# Patient Record
Sex: Female | Born: 1960 | Race: White | Hispanic: No | Marital: Married
Health system: Southern US, Community
[De-identification: ages and names within clinical notes are randomized; demographics above are authoritative.]

---

## 1998-06-11 ENCOUNTER — Emergency Department (HOSPITAL_COMMUNITY): Admission: EM | Admit: 1998-06-11 | Discharge: 1998-06-11 | Payer: Self-pay | Admitting: Emergency Medicine

## 1998-06-12 ENCOUNTER — Emergency Department (HOSPITAL_COMMUNITY): Admission: EM | Admit: 1998-06-12 | Discharge: 1998-06-12 | Payer: Self-pay | Admitting: Emergency Medicine

## 2001-08-23 ENCOUNTER — Emergency Department (HOSPITAL_COMMUNITY): Admission: EM | Admit: 2001-08-23 | Discharge: 2001-08-23 | Payer: Self-pay | Admitting: *Deleted

## 2001-08-23 ENCOUNTER — Encounter: Payer: Self-pay | Admitting: *Deleted

## 2002-03-10 ENCOUNTER — Emergency Department (HOSPITAL_COMMUNITY): Admission: EM | Admit: 2002-03-10 | Discharge: 2002-03-10 | Payer: Self-pay | Admitting: Emergency Medicine

## 2003-05-23 ENCOUNTER — Emergency Department (HOSPITAL_COMMUNITY): Admission: EM | Admit: 2003-05-23 | Discharge: 2003-05-23 | Payer: Self-pay | Admitting: Emergency Medicine

## 2004-01-27 ENCOUNTER — Emergency Department (HOSPITAL_COMMUNITY): Admission: EM | Admit: 2004-01-27 | Discharge: 2004-01-27 | Payer: Self-pay | Admitting: Family Medicine

## 2004-04-13 ENCOUNTER — Emergency Department (HOSPITAL_COMMUNITY): Admission: EM | Admit: 2004-04-13 | Discharge: 2004-04-13 | Payer: Self-pay

## 2004-09-28 ENCOUNTER — Emergency Department (HOSPITAL_COMMUNITY): Admission: EM | Admit: 2004-09-28 | Discharge: 2004-09-28 | Payer: Self-pay | Admitting: Emergency Medicine

## 2006-06-02 ENCOUNTER — Emergency Department (HOSPITAL_COMMUNITY): Admission: EM | Admit: 2006-06-02 | Discharge: 2006-06-02 | Payer: Self-pay | Admitting: Emergency Medicine

## 2007-06-14 ENCOUNTER — Emergency Department: Payer: Self-pay | Admitting: Internal Medicine

## 2007-11-28 ENCOUNTER — Other Ambulatory Visit: Payer: Self-pay

## 2007-11-28 ENCOUNTER — Inpatient Hospital Stay: Payer: Self-pay | Admitting: Internal Medicine

## 2007-12-17 ENCOUNTER — Emergency Department: Payer: Self-pay | Admitting: Emergency Medicine

## 2008-12-08 ENCOUNTER — Emergency Department: Payer: Self-pay | Admitting: Emergency Medicine

## 2009-04-23 ENCOUNTER — Emergency Department: Payer: Self-pay | Admitting: Emergency Medicine

## 2009-05-01 ENCOUNTER — Emergency Department: Payer: Self-pay | Admitting: Emergency Medicine

## 2009-09-10 ENCOUNTER — Emergency Department: Payer: Self-pay | Admitting: Emergency Medicine

## 2010-01-17 ENCOUNTER — Emergency Department: Payer: Self-pay | Admitting: Emergency Medicine

## 2010-06-15 ENCOUNTER — Emergency Department: Payer: Self-pay | Admitting: Emergency Medicine

## 2010-06-16 DIAGNOSIS — F411 Generalized anxiety disorder: Secondary | ICD-10-CM | POA: Insufficient documentation

## 2010-06-16 DIAGNOSIS — F17203 Nicotine dependence unspecified, with withdrawal: Secondary | ICD-10-CM | POA: Insufficient documentation

## 2010-07-04 ENCOUNTER — Emergency Department: Payer: Self-pay | Admitting: Emergency Medicine

## 2011-09-13 ENCOUNTER — Emergency Department: Payer: Self-pay | Admitting: Emergency Medicine

## 2011-10-11 ENCOUNTER — Ambulatory Visit: Payer: Self-pay

## 2014-02-02 ENCOUNTER — Emergency Department: Payer: Self-pay | Admitting: Internal Medicine

## 2014-06-04 ENCOUNTER — Emergency Department: Payer: Self-pay | Admitting: Emergency Medicine

## 2015-07-21 ENCOUNTER — Other Ambulatory Visit: Payer: Self-pay | Admitting: Anesthesiology

## 2015-07-21 DIAGNOSIS — R200 Anesthesia of skin: Secondary | ICD-10-CM

## 2015-07-21 DIAGNOSIS — M545 Low back pain, unspecified: Secondary | ICD-10-CM

## 2015-07-21 DIAGNOSIS — R202 Paresthesia of skin: Secondary | ICD-10-CM

## 2015-07-27 ENCOUNTER — Ambulatory Visit: Admission: RE | Admit: 2015-07-27 | Payer: Managed Care, Other (non HMO) | Source: Ambulatory Visit

## 2015-07-30 ENCOUNTER — Ambulatory Visit
Admission: RE | Admit: 2015-07-30 | Discharge: 2015-07-30 | Disposition: A | Discharge status: Home / Self Care | Payer: Managed Care, Other (non HMO) | Source: Ambulatory Visit | Attending: Anesthesiology | Admitting: Anesthesiology

## 2015-07-30 DIAGNOSIS — M545 Low back pain, unspecified: Secondary | ICD-10-CM

## 2015-07-30 DIAGNOSIS — R202 Paresthesia of skin: Secondary | ICD-10-CM | POA: Diagnosis present

## 2015-07-30 DIAGNOSIS — M5127 Other intervertebral disc displacement, lumbosacral region: Secondary | ICD-10-CM | POA: Diagnosis not present

## 2015-07-30 DIAGNOSIS — R200 Anesthesia of skin: Secondary | ICD-10-CM

## 2016-12-04 IMAGING — MR MR LUMBAR SPINE W/O CM
5 series · 39 of 48 positions shown · non-contrast
Comparison: 10/11/2011

CLINICAL DATA: Low back pain running into both hips and down the
right leg with numbness.

EXAM:
MRI LUMBAR SPINE WITHOUT CONTRAST
TECHNIQUE: Multiplanar, multisequence MR imaging of the lumbar spine was
performed. No intravenous contrast was administered.

[Series 2: T2 · sagittal · 4.0mm · 0.81mm/px · 6 of 17 slices shown (1 of 2)]
[im 1/17]
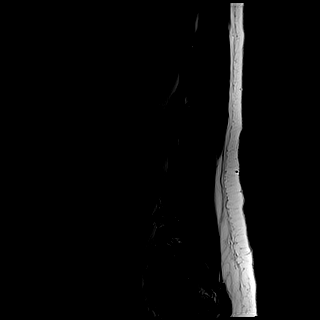
[im 4/17]
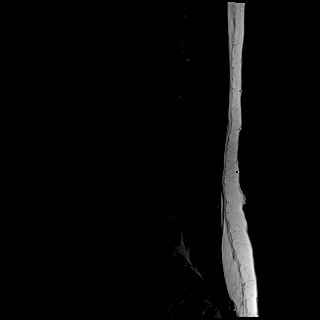
[im 7/17]
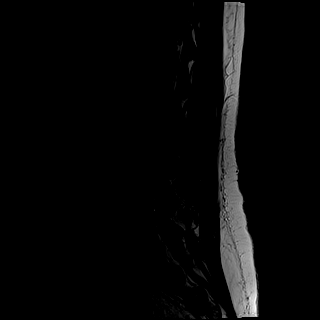
[im 10/17]
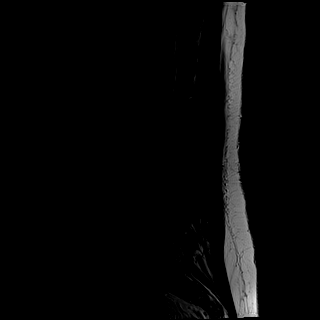
[im 13/17]
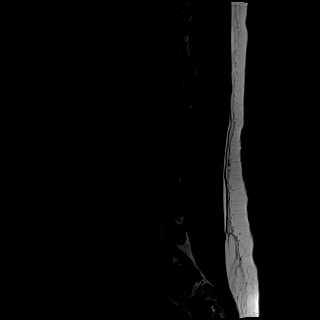
[im 17/17]
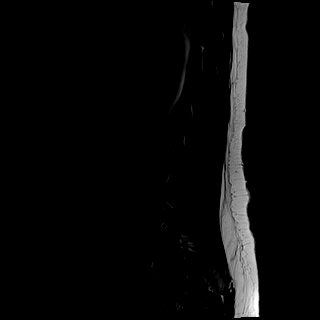

[Series 3: T1 · sagittal · 4.0mm · 0.81mm/px · 7 of 17 slices shown (1 of 2)]
[im 1/17]
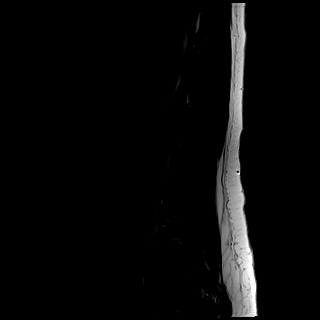
[im 3/17]
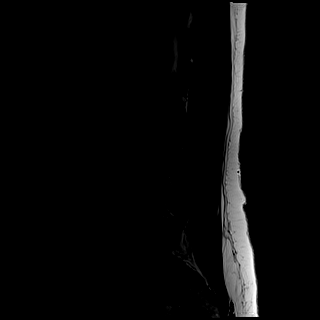
[im 6/17]
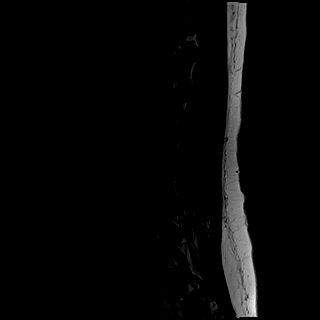
[im 9/17]
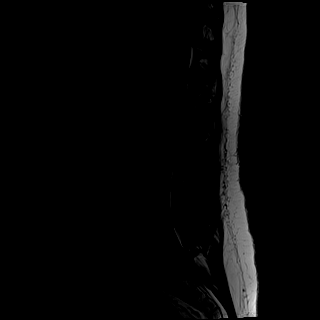
[im 11/17]
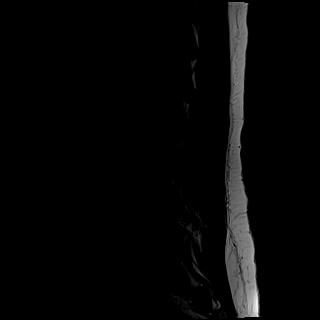
[im 14/17]
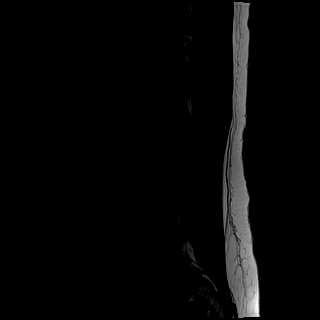
[im 17/17]
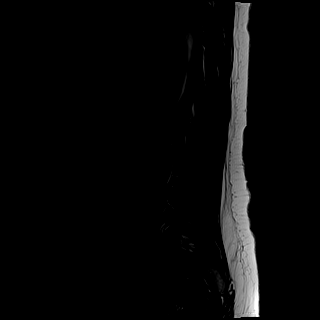

[Series 4: STIR · sagittal · 4.0mm · 1.02mm/px · 7 of 17 slices shown]
[im 1/17]
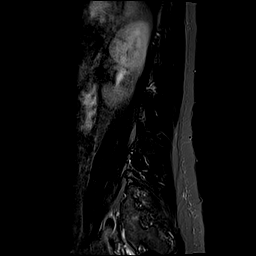
[im 3/17]
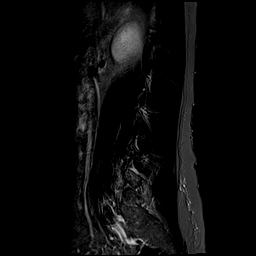
[im 6/17]
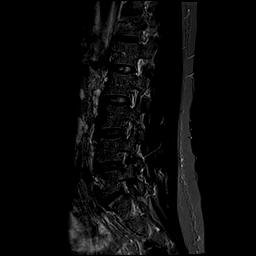
[im 9/17]
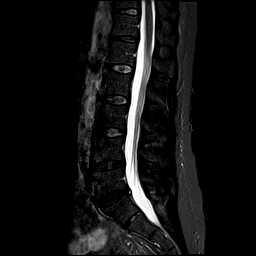
[im 11/17]
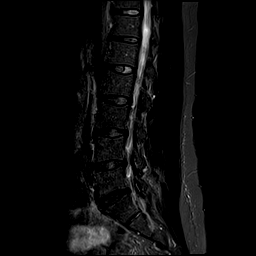
[im 14/17]
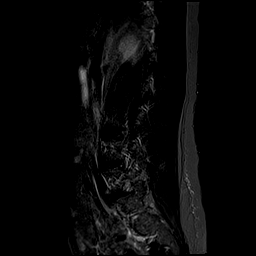
[im 17/17]
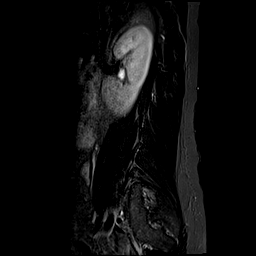

[Series 5: T2 · axial · 4.0mm · 0.78mm/px · z∈[-71,+137]mm · 11 of 37 slices shown (2 of 2)]
[im 1/37]
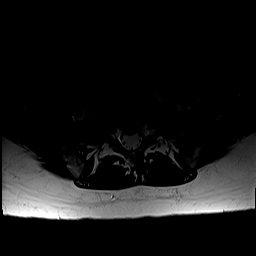
[im 3/37]
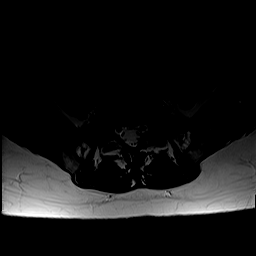
[im 6/37]
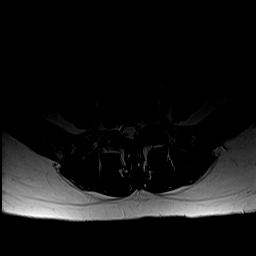
[im 9/37]
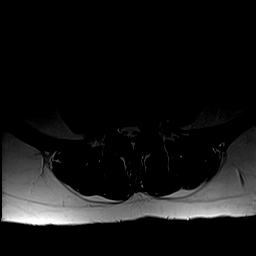
[im 12/37]
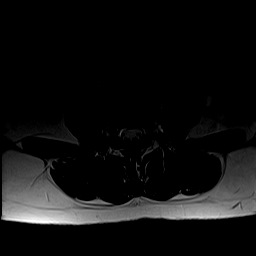
[im 14/37]
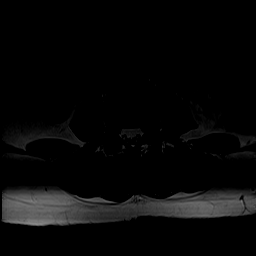
[im 17/37]
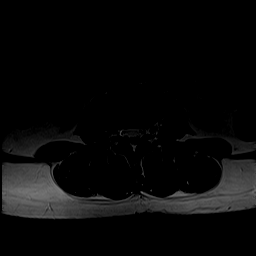
[im 20/37]
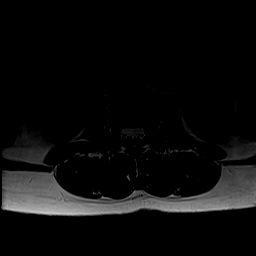
[im 25/37]
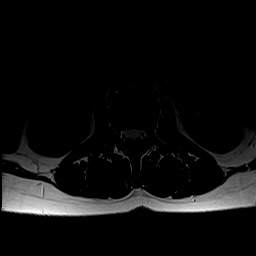
[im 31/37]
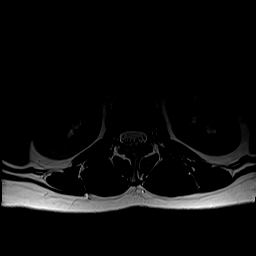
[im 37/37]
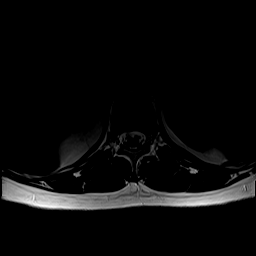

[Series 6: T1 · axial · 4.0mm · 0.39mm/px · z∈[-71,+137]mm · 8 of 37 slices shown (2 of 2)]
[im 1/37]
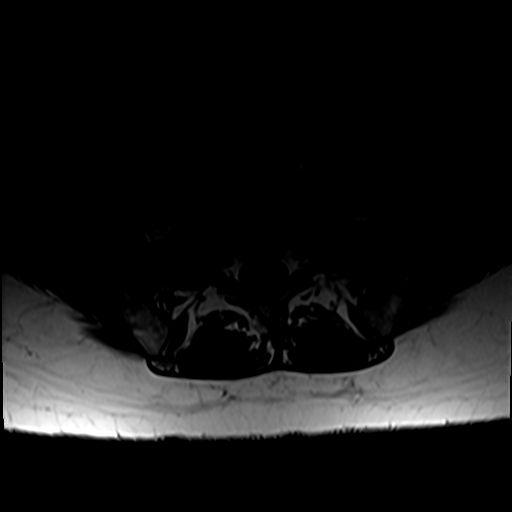
[im 6/37]
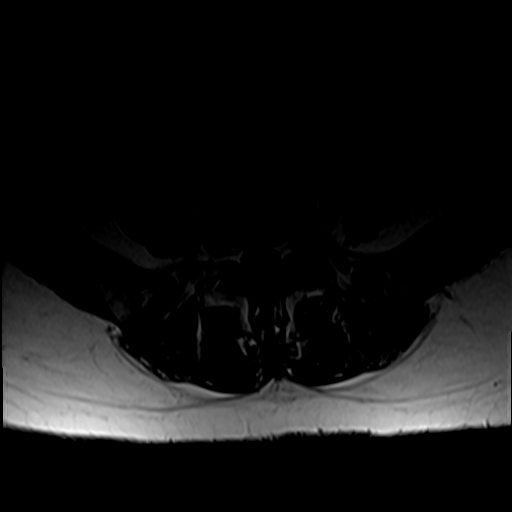
[im 12/37]
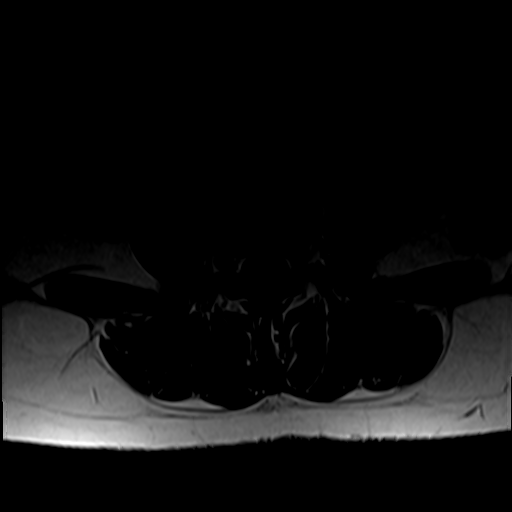
[im 17/37]
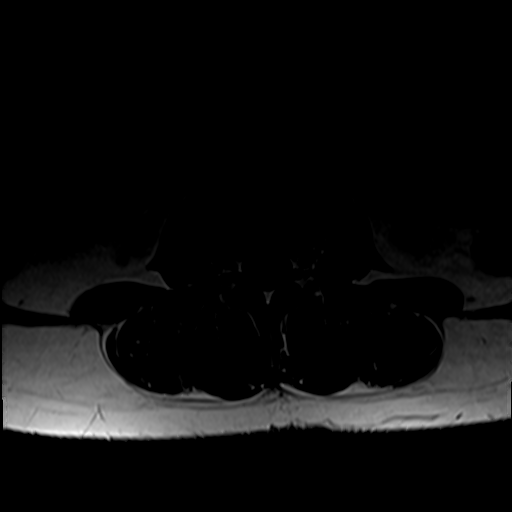
[im 20/37]
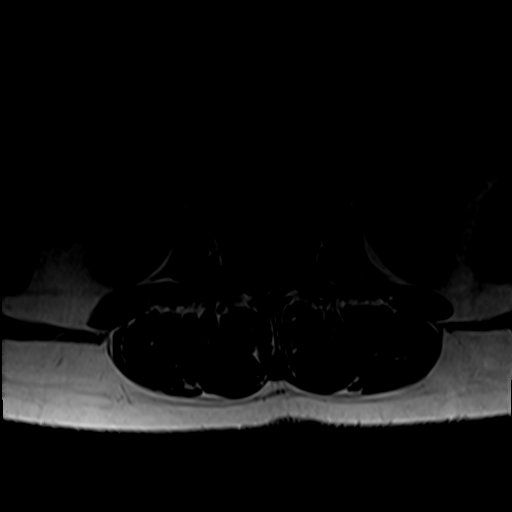
[im 25/37]
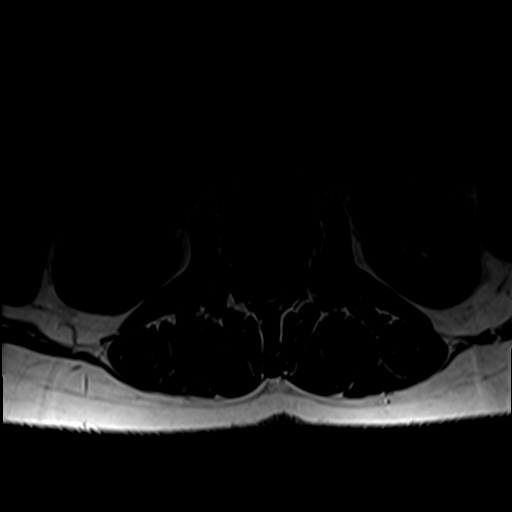
[im 31/37]
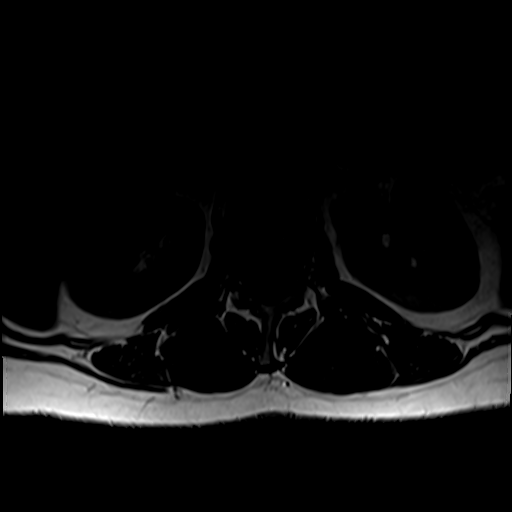
[im 37/37]
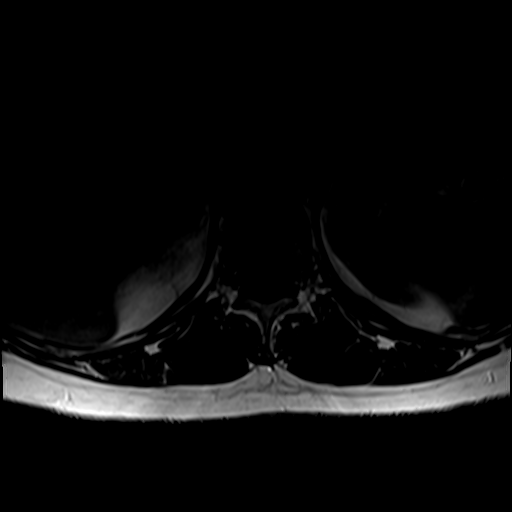

[39 of 48 positions shown; findings below may reference images not displayed]

FINDINGS: The vertebral bodies of the lumbar spine are normal in size. The
vertebral bodies of the lumbar spine are normal in alignment. There
is normal bone marrow signal demonstrated throughout the vertebra.
The intervertebral disc spaces are well-maintained.

The spinal cord is normal in signal and contour. The cord terminates
normally at L1 . The nerve roots of the cauda equina and the filum
terminale are normal.

The visualized portions of the SI joints are unremarkable.

There is a 12 mm exophytic right interpolar renal mass likely
representing a small cyst.

T12-L1: No significant disc bulge. No evidence of neural foraminal
stenosis. No central canal stenosis.

L1-L2: No significant disc bulge. No evidence of neural foraminal
stenosis. No central canal stenosis.

L2-L3: No significant disc bulge. No evidence of neural foraminal
stenosis. No central canal stenosis.

L3-L4: No significant disc bulge. No evidence of neural foraminal
stenosis. No central canal stenosis.

L4-L5: Mild broad-based disc bulge. No evidence of neural foraminal
stenosis. No central canal stenosis.

L5-S1: Very small shallow central disc protrusion. No evidence of
neural foraminal stenosis. No central canal stenosis.
IMPRESSION: 1. At L5-S1, there is a very small shallow central disc protrusion.
2. At L4-5, there is a mild broad-based disc bulge.

## 2018-06-18 ENCOUNTER — Ambulatory Visit: Payer: Self-pay | Admitting: Family Medicine

## 2021-03-25 DIAGNOSIS — Z0000 Encounter for general adult medical examination without abnormal findings: Secondary | ICD-10-CM | POA: Insufficient documentation

## 2021-03-25 DIAGNOSIS — J4489 Other specified chronic obstructive pulmonary disease: Secondary | ICD-10-CM | POA: Insufficient documentation

## 2021-03-25 DIAGNOSIS — F1190 Opioid use, unspecified, uncomplicated: Secondary | ICD-10-CM | POA: Insufficient documentation

## 2021-04-08 DIAGNOSIS — J302 Other seasonal allergic rhinitis: Secondary | ICD-10-CM | POA: Insufficient documentation

## 2021-05-01 DIAGNOSIS — K5909 Other constipation: Secondary | ICD-10-CM | POA: Insufficient documentation

## 2021-05-01 DIAGNOSIS — I272 Pulmonary hypertension, unspecified: Secondary | ICD-10-CM | POA: Insufficient documentation

## 2021-05-01 DIAGNOSIS — J398 Other specified diseases of upper respiratory tract: Secondary | ICD-10-CM | POA: Insufficient documentation

## 2021-05-01 DIAGNOSIS — J441 Chronic obstructive pulmonary disease with (acute) exacerbation: Secondary | ICD-10-CM | POA: Insufficient documentation

## 2021-05-01 DIAGNOSIS — G8929 Other chronic pain: Secondary | ICD-10-CM | POA: Insufficient documentation

## 2021-07-22 DIAGNOSIS — Z1211 Encounter for screening for malignant neoplasm of colon: Secondary | ICD-10-CM | POA: Insufficient documentation

## 2021-09-02 DIAGNOSIS — R030 Elevated blood-pressure reading, without diagnosis of hypertension: Secondary | ICD-10-CM | POA: Insufficient documentation

## 2021-12-18 DIAGNOSIS — I5030 Unspecified diastolic (congestive) heart failure: Secondary | ICD-10-CM | POA: Insufficient documentation

## 2021-12-19 DIAGNOSIS — J9622 Acute and chronic respiratory failure with hypercapnia: Secondary | ICD-10-CM | POA: Insufficient documentation

## 2021-12-19 DIAGNOSIS — J9601 Acute respiratory failure with hypoxia: Secondary | ICD-10-CM | POA: Insufficient documentation

## 2021-12-19 DIAGNOSIS — F17200 Nicotine dependence, unspecified, uncomplicated: Secondary | ICD-10-CM | POA: Insufficient documentation

## 2022-01-20 DIAGNOSIS — Z87891 Personal history of nicotine dependence: Secondary | ICD-10-CM | POA: Insufficient documentation

## 2022-03-02 ENCOUNTER — Telehealth: Payer: Self-pay | Admitting: Primary Care

## 2022-03-02 NOTE — Telephone Encounter (Signed)
Attempted to contact daughter to schedule Palliative Consult, no answer - left VM with reason for call along with my name and call back number, requesting return call.  I also attempted to contact patient at the number listed in Epic, phone rings once and then a busy signal.  I also attempted to contact mother Erin Green listed in Epic but this number has been disconnected.  ?

## 2022-03-15 ENCOUNTER — Telehealth: Payer: Self-pay | Admitting: Primary Care

## 2022-03-15 NOTE — Telephone Encounter (Signed)
Spoke with patient's daughter and explained why I was calling, told her that I was trying to schedule a Palliative Consult and verified patient's number with her and she stated that patient has a new number [phone]).   ? ?I then attempted to contact patient at new number and left a VM as to reason for call along with my name and call back number requesting a return call.  ?

## 2022-04-06 ENCOUNTER — Telehealth: Payer: Self-pay | Admitting: Primary Care

## 2022-04-06 NOTE — Telephone Encounter (Signed)
Attempted to contact patient to offer to schedule a Palliative Consult, no answer - left message with reason for call along with my name and and call back number, requesting a return call as soon as possible to schedule.

## 2022-04-07 ENCOUNTER — Telehealth: Payer: Self-pay | Admitting: Primary Care

## 2022-04-07 DIAGNOSIS — I509 Heart failure, unspecified: Secondary | ICD-10-CM | POA: Insufficient documentation

## 2022-04-07 NOTE — Telephone Encounter (Signed)
Attempted to contact patient again with no answer, to offer to schedule the Palliative Consult for tomorrow afternoon, if possible - left message requesting a return call as soon as possible to let me know, I also requested a call back to let me know if she doesn't want to pursue Palliative services as well.  Left my name and call back number.

## 2022-04-08 ENCOUNTER — Telehealth: Payer: Self-pay | Admitting: Primary Care

## 2022-04-08 NOTE — Telephone Encounter (Signed)
Rec'd voicemail message from patient, returned her call with no answer, requested a return call back to schedule Palliative Consult

## 2022-09-21 DIAGNOSIS — Z1231 Encounter for screening mammogram for malignant neoplasm of breast: Secondary | ICD-10-CM | POA: Insufficient documentation

## 2022-09-27 ENCOUNTER — Other Ambulatory Visit: Payer: Self-pay | Admitting: Student

## 2022-09-27 DIAGNOSIS — Z1231 Encounter for screening mammogram for malignant neoplasm of breast: Secondary | ICD-10-CM

## 2023-03-22 DIAGNOSIS — I878 Other specified disorders of veins: Secondary | ICD-10-CM | POA: Insufficient documentation

## 2023-11-09 DIAGNOSIS — R079 Chest pain, unspecified: Secondary | ICD-10-CM | POA: Insufficient documentation

## 2024-01-21 DIAGNOSIS — R7401 Elevation of levels of liver transaminase levels: Secondary | ICD-10-CM | POA: Insufficient documentation

## 2024-01-21 DIAGNOSIS — D751 Secondary polycythemia: Secondary | ICD-10-CM | POA: Insufficient documentation

## 2024-02-19 DIAGNOSIS — E119 Type 2 diabetes mellitus without complications: Secondary | ICD-10-CM | POA: Insufficient documentation

## 2024-04-11 DIAGNOSIS — R911 Solitary pulmonary nodule: Secondary | ICD-10-CM | POA: Insufficient documentation

## 2024-05-21 DIAGNOSIS — C3431 Malignant neoplasm of lower lobe, right bronchus or lung: Secondary | ICD-10-CM | POA: Insufficient documentation

## 2024-05-31 DIAGNOSIS — J95811 Postprocedural pneumothorax: Secondary | ICD-10-CM | POA: Insufficient documentation

## 2024-06-28 ENCOUNTER — Encounter: Payer: Self-pay | Admitting: *Deleted

## 2024-06-28 DIAGNOSIS — C3490 Malignant neoplasm of unspecified part of unspecified bronchus or lung: Secondary | ICD-10-CM

## 2024-06-28 NOTE — Progress Notes (Signed)
 Referral received from [facility] to see Dr. Chrystal for stage 1 adenocarcinoma of the lung. Pt is not a surgical candidate per notes and SBRT is recommended. Pt has had prior imaging at [facility] with LDCT and PET scan. Will obtain images for review.

## 2024-07-02 ENCOUNTER — Inpatient Hospital Stay
Admission: RE | Admit: 2024-07-02 | Discharge: 2024-07-02 | Disposition: A | Discharge status: Home / Self Care | Payer: Self-pay | Source: Ambulatory Visit | Attending: Radiation Oncology | Admitting: Radiation Oncology

## 2024-07-02 DIAGNOSIS — C3490 Malignant neoplasm of unspecified part of unspecified bronchus or lung: Secondary | ICD-10-CM

## 2024-07-02 NOTE — Progress Notes (Signed)
 Confirmed with [facility] that images have been pushed to [employer]. Per [facility], unable to upload from [employer] at this time due to technical difficulties. Will call back tomorrow to get an update.

## 2024-07-02 NOTE — Addendum Note (Signed)
 Addended by: VERDENE GILLS on: 07/02/2024 09:06 AM   Modules accepted: Orders

## 2024-07-03 ENCOUNTER — Encounter: Payer: Self-pay | Admitting: *Deleted

## 2024-07-03 ENCOUNTER — Telehealth: Payer: Self-pay

## 2024-07-03 ENCOUNTER — Ambulatory Visit
Admission: RE | Admit: 2024-07-03 | Discharge: 2024-07-03 | Disposition: A | Discharge status: Home / Self Care | Source: Ambulatory Visit | Attending: Radiation Oncology | Admitting: Radiation Oncology

## 2024-07-03 VITALS — BP 131/88 | HR 102 | Temp 97.0°F | Resp 20 | Ht <= 58 in | Wt 161.2 lb

## 2024-07-03 DIAGNOSIS — R911 Solitary pulmonary nodule: Secondary | ICD-10-CM

## 2024-07-03 DIAGNOSIS — C3431 Malignant neoplasm of lower lobe, right bronchus or lung: Secondary | ICD-10-CM

## 2024-07-03 NOTE — Consult Note (Signed)
 NEW PATIENT EVALUATION  Name: Erin Green  MRN: [id number]  Date:   07/03/2024     DOB: November 07, 1961   This 63 y.o. 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  REFERRING PHYSICIAN: No ref. provider found  CHIEF COMPLAINT:  Chief Complaint  Patient presents with   Lung Cancer    DIAGNOSIS: The encounter diagnosis was Solitary pulmonary nodule.   PREVIOUS INVESTIGATIONS:  CT scans PET CT scans reviewed Clinical notes reviewed Pathology reviewed  HPI: Patient is a 63 year old female who presented with chronic cough.  She is found on CT scan to have a right lower lobe pulmonary nodule.  PET CT scan demonstrated a 1.7 cm right lower lobe nodule with intense hypermetabolic activity.  She did have some reactive left hilar mediastinal nodes.  She also had a 0.5 cm left lower lobe pulmonary nodule too small for PET characterizations which will be followed. She underwent bronchoscopy at [facility] with pathology positive for her right lower lobe lesion to be adenocarcinoma.  Patient has significant COPD is nasal oxygen dependent.  She also has hypertension chronic pain.  She is seen today for consideration of radiation treatment.  She specifically [place] hemoptysis productive cough bone pain.  Patient did develop a pneumothorax after her biopsy in July. PLANNED TREATMENT REGIMEN: SBRT  PAST MEDICAL HISTORY:  has no past medical history on file.    PAST SURGICAL HISTORY:   FAMILY HISTORY: family history is not on file.  SOCIAL HISTORY:    ALLERGIES: Ketorolac tromethamine, Lidocaine, Other, Acetaminophen-codeine, and Ketorolac  MEDICATIONS:  Current Outpatient Medications  Medication Sig Dispense Refill   Ferrous Sulfate (IRON PO) Take 1 tablet by mouth daily.     fluticasone (FLONASE) 50 MCG/ACT nasal spray Place 1 spray into both nostrils daily.     guaifenesin  (HUMIBID E) 400 MG TABS tablet Take 400 mg by mouth every 6 (six) hours as needed.     oxyCODONE (OXY IR/ROXICODONE) 5 MG immediate release tablet Take 5 mg by mouth every 4 (four) hours as needed.     polyethylene glycol (MIRALAX / GLYCOLAX) 17 g packet Take by mouth daily.     sodium chloride (V-R NASAL SPRAY SALINE) 0.65 % nasal spray Place 1 spray into the nose as needed.     furosemide (LASIX) 40 MG tablet Take 40 mg by mouth 2 (two) times daily.     JARDIANCE 10 MG TABS tablet Take 10 mg by mouth daily.     TRELEGY ELLIPTA 100-62.5-25 MCG/ACT AEPB Inhale 1 puff into the lungs daily.     VENTOLIN HFA 108 (90 Base) MCG/ACT inhaler Inhale 2 puffs into the lungs every 4 (four) hours as needed.     No current facility-administered medications for this encounter.    ECOG PERFORMANCE STATUS:  0 - Asymptomatic  REVIEW OF SYSTEMS: Patient denies any weight loss, fatigue, weakness, fever, chills or night sweats. Patient denies any loss of vision, blurred vision. Patient denies any ringing  of the ears or hearing loss. No irregular heartbeat. Patient denies heart murmur or history of fainting. Patient denies any chest pain or pain radiating to her upper extremities. Patient denies any shortness of breath, difficulty breathing at night, cough or hemoptysis. Patient denies any swelling in the lower legs. Patient denies any nausea vomiting, vomiting of blood, or coffee ground material in the vomitus. Patient denies any stomach pain.  Patient states has had normal bowel movements no significant constipation or diarrhea. Patient denies any dysuria, hematuria or significant nocturia. Patient denies any problems walking, swelling in the joints or loss of balance. Patient denies any skin changes, loss of hair or loss of weight. Patient denies any excessive worrying or anxiety or significant depression. Patient denies any problems with insomnia. Patient denies excessive thirst, polyuria, polydipsia. Patient denies  any swollen glands, patient denies easy bruising or easy bleeding. Patient denies any recent infections, allergies or URI. Patient s visual fields have not changed significantly in recent time.   PHYSICAL EXAM: BP 131/88   Pulse (!) 102   Temp (!) 97 F (36.1 C) (Tympanic)   Resp 20   Ht 4' 10 (1.473 m)   Wt 161 lb 3.2 oz (73.1 kg)   SpO2 91%   PF (!) 2 L/min   BMI 33.69 kg/m  Patient on nasal oxygen appears frail well-developed well-nourished patient in NAD. HEENT reveals PERLA, EOMI, discs not visualized.  Oral cavity is clear. No oral mucosal lesions are identified. Neck is clear without evidence of cervical or supraclavicular adenopathy. Lungs are clear to A&P. Cardiac examination is essentially unremarkable with regular rate and rhythm without murmur rub or thrill. Abdomen is benign with no organomegaly or masses noted. Motor sensory and DTR levels are equal and symmetric in the upper and lower extremities. Cranial nerves II through XII are grossly intact. Proprioception is intact. No peripheral adenopathy or edema is identified. No motor or sensory levels are noted. Crude visual fields are within normal range.  LABORATORY DATA: Pathology reports reviewed    RADIOLOGY RESULTS: CT scans PET CT scans reviewed compatible with above-stated findings   IMPRESSION: Stage I A2 adenocarcinoma the right lower lobe in a 63 year old female with significant COPD  PLAN: This time we will plan on SBRT to her right lower lobe lesion.  Would plan on 35 Gray in 5 fractions.  Risks and benefits of treatment including possible fatigue possible development of cough possibility of it being a right lower lobe lesion to have some slight effect on her overall pulmonary status all were described in detail to the patient.  I personally set up and ordered CT simulation for next week.  Will do motion tracking and 4-dimensional planning.  Patient and husband both comprehend the treatment plan well.  I would like  to take this opportunity to thank you for allowing me to participate in the care of your patient.SABRA Marcey Penton, MD

## 2024-07-03 NOTE — Telephone Encounter (Signed)
 Clinical Social Work received referral from medical provider to address disability and financial resources.  CSW attempted to contact patient by phone.  Left a voicemail with CSW contact information and request for a return call.

## 2024-07-03 NOTE — Progress Notes (Signed)
 Met with patient during initial consult with Dr. Lenn. All questions answered during visit. Introduced to navigator services. Pt voiced concerns regarding applying for disability and needing financial resources. She has been unable to work due to her condition and recent cancer diagnosis. Informed pt that will place referral to our social worker, Macario, to further discuss her options. Contact info given and instructed to call with any questions or needs. Pt verbalized understanding.

## 2024-07-10 ENCOUNTER — Encounter: Payer: Self-pay | Admitting: *Deleted

## 2024-07-10 ENCOUNTER — Ambulatory Visit
Admission: RE | Admit: 2024-07-10 | Discharge: 2024-07-10 | Disposition: A | Discharge status: Home / Self Care | Source: Ambulatory Visit | Attending: Radiation Oncology | Admitting: Radiation Oncology

## 2024-07-10 DIAGNOSIS — Z510 Encounter for antineoplastic radiation therapy: Secondary | ICD-10-CM | POA: Diagnosis present

## 2024-07-10 DIAGNOSIS — C3431 Malignant neoplasm of lower lobe, right bronchus or lung: Secondary | ICD-10-CM | POA: Diagnosis not present

## 2024-07-22 DIAGNOSIS — Z510 Encounter for antineoplastic radiation therapy: Secondary | ICD-10-CM | POA: Diagnosis not present

## 2024-07-23 ENCOUNTER — Ambulatory Visit

## 2024-07-24 ENCOUNTER — Encounter: Payer: Self-pay | Admitting: *Deleted

## 2024-07-24 ENCOUNTER — Other Ambulatory Visit: Payer: Self-pay

## 2024-07-24 ENCOUNTER — Ambulatory Visit
Admission: RE | Admit: 2024-07-24 | Discharge: 2024-07-24 | Disposition: A | Discharge status: Home / Self Care | Source: Ambulatory Visit | Attending: Radiation Oncology | Admitting: Radiation Oncology

## 2024-07-24 DIAGNOSIS — Z510 Encounter for antineoplastic radiation therapy: Secondary | ICD-10-CM | POA: Diagnosis not present

## 2024-07-24 LAB — RAD ONC ARIA SESSION SUMMARY
Course Elapsed Days: 0
Plan Fractions Treated to Date: 1
Plan Prescribed Dose Per Fraction: 12 Gy
Plan Total Fractions Prescribed: 5
Plan Total Prescribed Dose: 60 Gy
Reference Point Dosage Given to Date: 12 Gy
Reference Point Session Dosage Given: 12 Gy
Session Number: 1

## 2024-07-25 ENCOUNTER — Ambulatory Visit

## 2024-07-25 ENCOUNTER — Inpatient Hospital Stay: Attending: Radiation Oncology

## 2024-07-25 NOTE — Progress Notes (Signed)
 [facility] Clinical Social Work  Initial Assessment   Erin Green is a 63 y.o. year old female contacted by phone. Clinical Social Work was referred by nurse navigator for assessment of psychosocial needs.   SDOH (Social Determinants of Health) assessments performed: Yes SDOH Interventions    Flowsheet Row CONSULT from 07/03/2024 in [facility] [place]-Radiation Oncology  SDOH Interventions   Housing Interventions Other (Comment)  [Referral]  Transportation Interventions Intervention Not Indicated  Financial Strain Interventions Financial Counselor    SDOH Screenings   Food Insecurity: No Food Insecurity (07/03/2024)  Housing: High Risk (07/03/2024)  Transportation Needs: No Transportation Needs (07/03/2024)  Utilities: Low Risk  (06/03/2024)   Received from [facility]  Depression [phone]): Low Risk  (07/03/2024)  Financial Resource Strain: High Risk (07/03/2024)  Tobacco Use: Medium Risk (06/04/2024)   Received from [facility]    PHQ 2/9:    07/03/2024   10:00 AM  Depression screen PHQ 2/9  Decreased Interest 2  Down, Depressed, Hopeless 2  PHQ - 2 Score 4  Difficult doing work/chores Somewhat difficult     Distress Screen completed: No     No data to display            Family/Social Information:  Housing Arrangement: patient lives with her husband, Erin Green. Family members/support persons in your life? Family.  Patient has a daughter and granddaughter that she is very close to. 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 Advanced directives: No Services Currently in place:  Medicaid.  Coping/ Adjustment to diagnosis: Patient understands treatment plan and what happens next? yes Concerns about diagnosis and/or treatment: Losing my job  and/or losing income Patient reported stressors: Therapist, art and/or priorities: Helping my husband. Patient enjoys time with family/ friends Current coping skills/ strengths: Active sense of humor , Capable of independent living , Communication skills , General fund of knowledge , and Supportive family/friends     SUMMARY: Current SDOH Barriers:  Financial constraints related to not receiving Psychologist, clinical.  Clinical Social Work Clinical Goal(s):  Explore Erin resource options for unmet needs related to:  Financial Strain   Interventions: Discussed common feeling and emotions when being diagnosed with cancer, and the importance of support during treatment Informed patient of the support team roles and support services at [facility] Provided CSW contact information and encouraged patient to call with any questions or concerns Provided brief mental health counseling with regard to adjusting to her illness. Patient receives food stamps and meets financial eligibility for the [employer].  Made referral to Erin Green.  Will also contact the [facility] regarding SSD.  Patient said she was denied previously prior to her cancer diagnosis.   Follow Up Plan: CSW will follow-up with patient by phone  Patient verbalizes understanding of plan: Yes    Erin CHRISTELLA Au, LCSW Clinical Social Worker [facility]

## 2024-07-29 ENCOUNTER — Other Ambulatory Visit: Payer: Self-pay

## 2024-07-29 ENCOUNTER — Ambulatory Visit
Admission: RE | Admit: 2024-07-29 | Discharge: 2024-07-29 | Disposition: A | Discharge status: Home / Self Care | Source: Ambulatory Visit | Attending: Radiation Oncology | Admitting: Radiation Oncology

## 2024-07-29 DIAGNOSIS — Z510 Encounter for antineoplastic radiation therapy: Secondary | ICD-10-CM | POA: Diagnosis not present

## 2024-07-29 LAB — RAD ONC ARIA SESSION SUMMARY
Course Elapsed Days: 5
Plan Fractions Treated to Date: 2
Plan Prescribed Dose Per Fraction: 12 Gy
Plan Total Fractions Prescribed: 5
Plan Total Prescribed Dose: 60 Gy
Reference Point Dosage Given to Date: 24 Gy
Reference Point Session Dosage Given: 12 Gy
Session Number: 2

## 2024-07-30 ENCOUNTER — Telehealth: Payer: Self-pay | Admitting: Pharmacy Technician

## 2024-07-30 ENCOUNTER — Ambulatory Visit

## 2024-07-30 ENCOUNTER — Inpatient Hospital Stay

## 2024-07-30 NOTE — Progress Notes (Signed)
 [facility] CSW Progress Note  Clinical Child psychotherapist contacted patient by phone to follow-up on financial concerns.    Interventions: Provided patient with information about the process for receiving the [facility]  She said she understood.  Also informed her that she could stop at the front desk to receive a bag of food from the pantry and a [employer] card.  Also informed Samule Bertrand.        Follow Up Plan:  CSW will follow-up with patient by phone     [name], LCSW Clinical Social Worker [facility]    Patient is participating in a Managed Medicaid Plan:  Yes

## 2024-07-30 NOTE — Telephone Encounter (Signed)
 Attempted to call patient to discuss the [employer].  Unable to reach.  Left message for patient to contact me.  I am mailing the the Patient Acknowledgement Form and a letter letting patient know that we need to obtain a copy of her EBT card.  Provided patient with my contact information.  Dickey DOROTHA Fritter Patient Pharmacologist [facility]

## 2024-07-31 ENCOUNTER — Ambulatory Visit

## 2024-08-01 ENCOUNTER — Ambulatory Visit

## 2024-08-05 ENCOUNTER — Ambulatory Visit
Admission: RE | Admit: 2024-08-05 | Discharge: 2024-08-05 | Disposition: A | Discharge status: Home / Self Care | Source: Ambulatory Visit | Attending: Radiation Oncology | Admitting: Radiation Oncology

## 2024-08-05 ENCOUNTER — Other Ambulatory Visit: Payer: Self-pay

## 2024-08-05 DIAGNOSIS — Z510 Encounter for antineoplastic radiation therapy: Secondary | ICD-10-CM | POA: Diagnosis not present

## 2024-08-05 LAB — RAD ONC ARIA SESSION SUMMARY
Course Elapsed Days: 12
Plan Fractions Treated to Date: 3
Plan Prescribed Dose Per Fraction: 12 Gy
Plan Total Fractions Prescribed: 5
Plan Total Prescribed Dose: 60 Gy
Reference Point Dosage Given to Date: 36 Gy
Reference Point Session Dosage Given: 12 Gy
Session Number: 3

## 2024-08-06 ENCOUNTER — Ambulatory Visit

## 2024-08-07 ENCOUNTER — Ambulatory Visit

## 2024-08-07 ENCOUNTER — Ambulatory Visit
Admission: RE | Admit: 2024-08-07 | Discharge: 2024-08-07 | Disposition: A | Discharge status: Home / Self Care | Source: Ambulatory Visit | Attending: Radiation Oncology | Admitting: Radiation Oncology

## 2024-08-07 ENCOUNTER — Other Ambulatory Visit: Payer: Self-pay

## 2024-08-07 DIAGNOSIS — C3431 Malignant neoplasm of lower lobe, right bronchus or lung: Secondary | ICD-10-CM | POA: Insufficient documentation

## 2024-08-07 DIAGNOSIS — Z510 Encounter for antineoplastic radiation therapy: Secondary | ICD-10-CM | POA: Diagnosis present

## 2024-08-07 LAB — RAD ONC ARIA SESSION SUMMARY
Course Elapsed Days: 14
Plan Fractions Treated to Date: 4
Plan Prescribed Dose Per Fraction: 12 Gy
Plan Total Fractions Prescribed: 5
Plan Total Prescribed Dose: 60 Gy
Reference Point Dosage Given to Date: 48 Gy
Reference Point Session Dosage Given: 12 Gy
Session Number: 4

## 2024-08-09 ENCOUNTER — Inpatient Hospital Stay: Attending: Radiation Oncology

## 2024-08-09 NOTE — Progress Notes (Signed)
 [facility] CSW Progress Note  Clinical Social Worker returned patient's call to follow-up on disability concerns.    Interventions: Provided patient with information about the referral process for the [facility]  CSW had received confirmation that the referral could be made even though patient had previously been denied social security disability.       Follow Up Plan:  CSW will follow-up with patient by phone     [name], LCSW Clinical Social Worker [facility]    Patient is participating in a Managed Medicaid Plan:  Yes

## 2024-08-12 ENCOUNTER — Other Ambulatory Visit: Payer: Self-pay

## 2024-08-12 ENCOUNTER — Ambulatory Visit
Admission: RE | Admit: 2024-08-12 | Discharge: 2024-08-12 | Disposition: A | Discharge status: Home / Self Care | Source: Ambulatory Visit | Attending: Radiation Oncology | Admitting: Radiation Oncology

## 2024-08-12 DIAGNOSIS — Z510 Encounter for antineoplastic radiation therapy: Secondary | ICD-10-CM | POA: Diagnosis not present

## 2024-08-12 LAB — RAD ONC ARIA SESSION SUMMARY
Course Elapsed Days: 19
Plan Fractions Treated to Date: 5
Plan Prescribed Dose Per Fraction: 12 Gy
Plan Total Fractions Prescribed: 5
Plan Total Prescribed Dose: 60 Gy
Reference Point Dosage Given to Date: 60 Gy
Reference Point Session Dosage Given: 12 Gy
Session Number: 5

## 2024-08-16 NOTE — Radiation Completion Notes (Signed)
 Patient Name: Erin Green, MADRIL MRN: [id number] Date of Birth: [birth year] Referring Physician:  , M.D. Date of Service: 2024-08-16 Radiation Oncologist: Marcey Penton, M.D. [facility]                             RADIATION ONCOLOGY END OF TREATMENT NOTE     Diagnosis: R91.1 Solitary pulmonary nodule Intent: Curative     HPI: Patient is a 63 year old female who presented with chronic cough.  She is found on CT scan to have a right lower lobe pulmonary nodule.  PET CT scan demonstrated a 1.7 cm right lower lobe nodule with intense hypermetabolic activity.  She did have some reactive left hilar mediastinal nodes.  She also had a 0.5 cm left lower lobe pulmonary nodule too small for PET characterizations which will be followed.      ==========DELIVERED PLANS==========  First Treatment Date: 2024-07-24 Last Treatment Date: 2024-08-12   Plan Name: Lung_R_SBRT Site: Lung, Right Technique: SBRT/SRT-IMRT Mode: Photon Dose Per Fraction: 12 Gy Prescribed Dose (Delivered / Prescribed): 60 Gy / 60 Gy Prescribed Fxs (Delivered / Prescribed): 5 / 5     ==========ON TREATMENT VISIT DATES========== 2024-07-24, 2024-07-29, 2024-08-05, 2024-08-05, 2024-08-07, 2024-08-12     ==========UPCOMING VISITS========== 09/09/2024 [facility] RAD ONCOLOGY FOLLOW UP 30 Chrystal, Glenn, MD        ==========APPENDIX - ON TREATMENT VISIT NOTES==========   See weekly On Treatment Notes in Epic for details in the Media tab (listed as Progress notes on the On Treatment Visit Dates listed above).

## 2024-08-20 ENCOUNTER — Telehealth: Payer: Self-pay | Admitting: Pharmacy Technician

## 2024-08-20 NOTE — Telephone Encounter (Signed)
 Patient approved for the [employer].  Erin Green Patient Pharmacologist [facility]

## 2024-08-21 ENCOUNTER — Telehealth: Payer: Self-pay | Admitting: Pharmacy Technician

## 2024-08-21 NOTE — Telephone Encounter (Signed)
 Patient called concerned about when [facility] will send check for her [employer] that is due on 08/23/24.  I made patient aware that [employer] is processing her request and that I have contacted [employer] and spoke with Abby to provide a commitment so that service will not be interrupted.  Patient stated that was fine.  Dickey DOROTHA Fritter Patient Pharmacologist [facility]

## 2024-08-28 ENCOUNTER — Inpatient Hospital Stay: Admitting: Licensed Clinical Social Worker

## 2024-08-28 NOTE — Progress Notes (Signed)
 [facility] CSW Progress Note  Clinical Child psychotherapist contacted patient by phone to follow-up on financial concerns.    Interventions: Patient said she has a letter for her rent that she will [facility] the [facility].  CSW will inform [name] since she is addressing the [employer].  She expressed feeling stressed because her husband is in a [facility] and has been diagnosed with multiple myeloma.  CSW provided supportive counseling.      Follow Up Plan:  CSW will follow-up with patient by phone     [name], LCSW Clinical Social Worker [facility]    Patient is participating in a Managed Medicaid Plan:  Yes

## 2024-09-09 ENCOUNTER — Ambulatory Visit: Admitting: Radiation Oncology

## 2024-09-11 ENCOUNTER — Ambulatory Visit: Admitting: Radiation Oncology

## 2024-09-27 ENCOUNTER — Telehealth: Payer: Self-pay | Admitting: Radiation Oncology

## 2024-09-27 NOTE — Telephone Encounter (Signed)
 Patient called and would like to reschedule appointment she was very upset and has a lot going on. She said her husband passed and the bank has closed her account. She is very frustrated. Anyway, Please call patient to reschedule her appointment.

## 2024-10-22 ENCOUNTER — Telehealth: Payer: Self-pay | Admitting: Radiation Oncology

## 2024-10-22 NOTE — Telephone Encounter (Signed)
 Pt called to r/s Chrystal appt - no ride - send message to Chrystal team to get appt r/s - LH

## 2024-10-24 ENCOUNTER — Ambulatory Visit: Admitting: Radiation Oncology

## 2024-12-05 ENCOUNTER — Ambulatory Visit: Admitting: Radiation Oncology

## 2024-12-12 ENCOUNTER — Telehealth: Payer: Self-pay | Admitting: Radiation Oncology

## 2024-12-12 NOTE — Telephone Encounter (Signed)
 Pt called to r/s Chrystal appt - forwarded call - LH

## 2025-01-02 ENCOUNTER — Ambulatory Visit: Admitting: Radiation Oncology
